# Patient Record
Sex: Female | Born: 1995 | Race: White | Hispanic: No | Marital: Single | Smoking: Never smoker
Health system: Southern US, Community
[De-identification: ages and names within clinical notes are randomized; demographics above are authoritative.]

## PROBLEM LIST (undated history)

## (undated) DIAGNOSIS — R55 Syncope and collapse: Secondary | ICD-10-CM

## (undated) HISTORY — DX: Syncope and collapse: R55

---
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
## 2017-08-23 ENCOUNTER — Ambulatory Visit: Payer: Managed Care, Other (non HMO) | Admitting: Family Medicine

## 2017-08-23 ENCOUNTER — Encounter: Payer: Self-pay | Admitting: Family Medicine

## 2017-08-23 VITALS — BP 106/78 | HR 96 | Temp 98.3°F | Wt 164.0 lb

## 2017-08-23 DIAGNOSIS — L600 Ingrowing nail: Secondary | ICD-10-CM | POA: Diagnosis not present

## 2017-08-23 MED ORDER — SULFAMETHOXAZOLE-TRIMETHOPRIM 800-160 MG PO TABS: 1.0000 | ORAL_TABLET | Freq: Two times a day (BID) | ORAL | 0 refills | Status: AC

## 2017-08-23 NOTE — Patient Instructions (Signed)
Ingrown Toenail An ingrown toenail occurs when the corner or sides of your toenail grow into the surrounding skin. The big toe is most commonly affected, but it can happen to any of your toes. If your ingrown toenail is not treated, you will be at risk for infection. What are the causes? This condition may be caused by:  Wearing shoes that are too small or tight.  Injury or trauma, such as stubbing your toe or having your toe stepped on.  Improper cutting or care of your toenails.  Being born with (congenital) nail or foot abnormalities, such as having a nail that is too big for your toe.  What increases the risk? Risk factors for an ingrown toenail include:  Age. Your nails tend to thicken as you get older, so ingrown nails are more common in older people.  Diabetes.  Cutting your toenails incorrectly.  Blood circulation problems.  What are the signs or symptoms? Symptoms may include:  Pain, soreness, or tenderness.  Redness.  Swelling.  Hardening of the skin surrounding the toe.  Your ingrown toenail may be infected if there is fluid, pus, or drainage. How is this diagnosed? An ingrown toenail may be diagnosed by medical history and physical exam. If your toenail is infected, your health care provider may test a sample of the drainage. How is this treated? Treatment depends on the severity of your ingrown toenail. Some ingrown toenails may be treated at home. More severe or infected ingrown toenails may require surgery to remove all or part of the nail. Infected ingrown toenails may also be treated with antibiotic medicines. Follow these instructions at home:  If you were prescribed an antibiotic medicine, finish all of it even if you start to feel better.  Soak your foot in warm soapy water for 20 minutes, 3 times per day or as directed by your health care provider.  Carefully lift the edge of the nail away from the sore skin by wedging a small piece of cotton under  the corner of the nail. This may help with the pain. Be careful not to cause more injury to the area.  Wear shoes that fit well. If your ingrown toenail is causing you pain, try wearing sandals, if possible.  Trim your toenails regularly and carefully. Do not cut them in a curved shape. Cut your toenails straight across. This prevents injury to the skin at the corners of the toenail.  Keep your feet clean and dry.  If you are having trouble walking and are given crutches by your health care provider, use them as directed.  Do not pick at your toenail or try to remove it yourself.  Take medicines only as directed by your health care provider.  Keep all follow-up visits as directed by your health care provider. This is important. Contact a health care provider if:  Your symptoms do not improve with treatment. Get help right away if:  You have red streaks that start at your foot and go up your leg.  You have a fever.  You have increased redness, swelling, or pain.  You have fluid, blood, or pus coming from your toenail. This information is not intended to replace advice given to you by your health care provider. Make sure you discuss any questions you have with your health care provider. Document Released: 01/14/2000 Document Revised: 06/18/2015 Document Reviewed: 12/10/2013 Elsevier Interactive Patient Education  2018 Elsevier Inc.  

## 2017-08-23 NOTE — Progress Notes (Signed)
Subjective:    Patient ID: Destiny Pruitt, female    DOB: [birth year], 22 y.o.   MRN: [id number]  No chief complaint on file.   HPI Patient was seen today for acute concern.  Pt endorses ingrown right great toenail.  She tried soaking in warm water and putting a piece of cotton underneath the toenail to relieve pressure.  In the past pt had left great toe nail partially removed.  Past Medical History:  Diagnosis Date  . Syncope    triggered by watching trauma    No Known Allergies  ROS General: Denies fever, chills, night sweats, changes in weight, changes in appetite HEENT: Denies headaches, ear pain, changes in vision, rhinorrhea, sore throat CV: Denies CP, palpitations, SOB, orthopnea Pulm: Denies SOB, cough, wheezing GI: Denies abdominal pain, nausea, vomiting, diarrhea, constipation GU: Denies dysuria, hematuria, frequency, vaginal discharge Msk: Denies muscle cramps, joint pains  +R great toe pain Neuro: Denies weakness, numbness, tingling Skin: Denies rashes, bruising Psych: Denies depression, anxiety, hallucinations     Objective:    Blood pressure 106/78, pulse 96, temperature 98.3 F (36.8 C), temperature source Oral, weight 164 lb (74.4 kg), SpO2 98 %.   Gen. Pleasant, well-nourished, in no distress, normal affect   Lungs: no accessory muscle use Cardiovascular: RRR, no peripheral edema Neuro:  A&Ox3, CN II-XII intact, normal gait Skin:  Warm, no rash.  R great toenail lateral edge erythematous, mildly edematous, with minimal clear to yellowish drainage.    Wt Readings from Last 3 Encounters:  08/23/17 164 lb (74.4 kg) (89 %, Z= 1.25)*  08/28/16 162 lb 8 oz (73.7 kg) (90 %, Z= 1.29)*  02/03/16 163 lb 6.4 oz (74.1 kg) (91 %, Z= 1.35)*   * Growth percentiles are based on CDC (Girls, 2-20 Years) data.    Lab Results  Component Value Date   HGB 15.5 11/16/2011    Assessment/Plan:  Ingrown toenail of right foot  -Continue soaking toe and using cotton  underneath nail edge. - Plan: Ambulatory referral to Podiatry, sulfamethoxazole-trimethoprim (BACTRIM DS,SEPTRA DS) 800-160 MG tablet  F/u prn  Grier Mitts, MD

## 2017-08-28 ENCOUNTER — Ambulatory Visit: Payer: BLUE CROSS/BLUE SHIELD | Admitting: Sports Medicine

## 2017-08-29 ENCOUNTER — Other Ambulatory Visit: Payer: Self-pay | Admitting: Family Medicine

## 2017-08-29 NOTE — Telephone Encounter (Signed)
Please advise refill in PCP absence.  Last OV w/ PCP was 1 year ago for acute visit.

## 2017-08-31 ENCOUNTER — Other Ambulatory Visit: Payer: Self-pay | Admitting: Family Medicine

## 2017-08-31 DIAGNOSIS — L700 Acne vulgaris: Secondary | ICD-10-CM

## 2017-09-01 ENCOUNTER — Ambulatory Visit: Payer: Managed Care, Other (non HMO) | Admitting: Sports Medicine

## 2017-09-01 ENCOUNTER — Encounter: Payer: Self-pay | Admitting: Sports Medicine

## 2017-09-01 VITALS — BP 125/80 | HR 80 | Temp 98.0°F

## 2017-09-01 DIAGNOSIS — L600 Ingrowing nail: Secondary | ICD-10-CM

## 2017-09-01 DIAGNOSIS — M79675 Pain in left toe(s): Secondary | ICD-10-CM | POA: Diagnosis not present

## 2017-09-01 DIAGNOSIS — M79674 Pain in right toe(s): Secondary | ICD-10-CM

## 2017-09-01 NOTE — Progress Notes (Signed)
Subjective: Destiny Pruitt is a 22 y.o. female patient presents to office today complaining of a moderately painful incurvated, red, hot, swollen Lateral>Medial nail borders of the 1st toes on the Right>Left foot. This has been present for years but last week got bad, bactrim completed 08/31/17. Patient denies fever/chills/nausea/vomitting/any other related constitutional symptoms at this time.  Admits history of ingrown nails as a child.   Review of Systems  All other systems reviewed and are negative.    Patient Active Problem List   Diagnosis Date Noted  . Viral URI with cough 12/20/2015  . Acne 11/16/2011  . Routine general medical examination at a health care facility 07/20/2011    Current Outpatient Medications on File Prior to Visit  Medication Sig Dispense Refill  . cephALEXin (KEFLEX) 500 MG capsule TAKE 1 CAPSULE BY MOUTH TWICE DAILY 100 capsule 4  . KELNOR 1/50 1-50 MG-MCG tablet TAKE 1 TABLET BY MOUTH EVERY DAY 84 tablet 0   No current facility-administered medications on file prior to visit.     No Known Allergies  Objective:  There were no vitals filed for this visit.  General: Well developed, nourished, in no acute distress, alert and oriented x3   Dermatology: Skin is warm, dry and supple bilateral. Right and left hallux nail appears to be  severely incurvated with hyperkeratosis formation at the distal aspects of  the lateral>medial nail borders. (+) Erythema. (+) Edema. (-) serosanguous  drainage present. The remaining nails appear unremarkable at this time. There are no open sores, lesions or other signs of infection present.  Vascular: Dorsalis Pedis artery and Posterior Tibial artery pedal pulses are 2/4 bilateral with immedate capillary fill time. Pedal hair growth present. No lower extremity edema.   Neruologic: Grossly intact via light touch bilateral.  Musculoskeletal: Tenderness to palpation of the right and left hallux nail fold(s). Muscular strength  within normal limits in all groups bilateral.   Assesement and Plan: Problem List Items Addressed This Visit    None    Visit Diagnoses    Ingrown nail    -  Primary   Toe pain, right       Toe pain, left         -Discussed treatment alternatives and plan of care; Explained permanent/temporary nail avulsion and post procedure course to patient. - After a verbal and written consent, injected 3 ml of a 50:50 mixture of 2% plain  lidocaine and 0.5% plain marcaine in a normal hallux block fashion. Next, a  betadine prep was performed. Anesthesia was tested and found to be appropriate.  The offending right and left hallux medial and lateral nail borders were then incised from the hyponychium to the epinychium. The offending nail border was removed and cleared from the field. The area was curretted for any remaining nail or spicules. Phenol application performed and the area was then flushed with alcohol and dressed with antibiotic cream and a dry sterile dressing. -Patient was instructed to leave the dressing intact for today and begin soaking  in a weak solution of betadine or Epsom salt and water tomorrow. Patient was instructed to  soak for 15 minutes each day and apply neosporin and a gauze or bandaid dressing each day. -Patient was instructed to monitor the toe for signs of infection and return to office if toe becomes red, hot or swollen. -Advised ice, elevation, and tylenol or motrin if needed for pain.  -Patient is to return in 1-2 weeks for follow up care/nail check or  sooner if problems arise.  Landis Martins, DPM

## 2017-09-01 NOTE — Patient Instructions (Signed)

## 2017-09-01 NOTE — Progress Notes (Signed)
   Subjective:    Patient ID: Destiny Pruitt, female    DOB: 12-09-1995, 22 y.o.   MRN: [id number]  HPI    Review of Systems  All other systems reviewed and are negative.      Objective:   Physical Exam        Assessment & Plan:

## 2017-09-03 NOTE — Telephone Encounter (Signed)
This is Dr Sherren Mocha pt, takes Cephalexin 500 mg for Acne, pt last OV was 09/01/2017 and last refill was 3/19 2018, Please advise if ok to refill.

## 2017-09-06 ENCOUNTER — Ambulatory Visit (INDEPENDENT_AMBULATORY_CARE_PROVIDER_SITE_OTHER): Payer: Self-pay

## 2017-09-06 DIAGNOSIS — L600 Ingrowing nail: Secondary | ICD-10-CM

## 2017-09-06 NOTE — Patient Instructions (Signed)

## 2017-09-11 NOTE — Progress Notes (Signed)
Patient presents for follow-up appointment today.  Procedure performed no avulsion bilateral hallux nails both borders, performed on 09/01/2017.  She states that her nails are little sore, but overall everything is feeling much better than it was before.  Noted well-healing sites, with slight maceration to the toes due to constant bandaging.  No redness, no erythema, no swelling, slight clear yellowish drainage noted on bandages.    Discussed continuation of soaking and Epson salt, bandage on during the day and off at night.  Written and verbal instructions were given and understood by patient.  I did advise her to follow-up in 2 weeks, but she states she is going to college at [employer] and will not be able to come in for her appointment.  I told her if there is any questions or concerns she could call our office, or she could go to an urgent care near her.

## 2017-11-24 ENCOUNTER — Other Ambulatory Visit: Payer: Self-pay | Admitting: Internal Medicine

## 2017-11-25 ENCOUNTER — Other Ambulatory Visit: Payer: Self-pay | Admitting: Family Medicine

## 2018-01-15 ENCOUNTER — Encounter: Payer: Managed Care, Other (non HMO) | Admitting: Family Medicine

## 2018-01-18 ENCOUNTER — Ambulatory Visit: Payer: Managed Care, Other (non HMO) | Admitting: Family Medicine

## 2018-01-18 ENCOUNTER — Encounter: Payer: Self-pay | Admitting: Family Medicine

## 2018-01-18 VITALS — BP 98/82 | HR 96 | Temp 98.2°F | Ht 66.0 in | Wt 167.0 lb

## 2018-01-18 DIAGNOSIS — N6001 Solitary cyst of right breast: Secondary | ICD-10-CM

## 2018-01-18 DIAGNOSIS — Z0000 Encounter for general adult medical examination without abnormal findings: Secondary | ICD-10-CM | POA: Diagnosis not present

## 2018-01-18 DIAGNOSIS — L700 Acne vulgaris: Secondary | ICD-10-CM | POA: Diagnosis not present

## 2018-01-18 DIAGNOSIS — Z23 Encounter for immunization: Secondary | ICD-10-CM

## 2018-01-18 NOTE — Patient Instructions (Signed)
Preventive Care 18-39 Years, Female Preventive care refers to lifestyle choices and visits with your health care provider that can promote health and wellness. What does preventive care include?   A yearly physical exam. This is also called an annual well check.  Dental exams once or twice a year.  Routine eye exams. Ask your health care provider how often you should have your eyes checked.  Personal lifestyle choices, including: ? Daily care of your teeth and gums. ? Regular physical activity. ? Eating a healthy diet. ? Avoiding tobacco and drug use. ? Limiting alcohol use. ? Practicing safe sex. ? Taking vitamin and mineral supplements as recommended by your health care provider. What happens during an annual well check? The services and screenings done by your health care provider during your annual well check will depend on your age, overall health, lifestyle risk factors, and family history of disease. Counseling Your health care provider may ask you questions about your:  Alcohol use.  Tobacco use.  Drug use.  Emotional well-being.  Home and relationship well-being.  Sexual activity.  Eating habits.  Work and work environment.  Method of birth control.  Menstrual cycle.  Pregnancy history. Screening You may have the following tests or measurements:  Height, weight, and BMI.  Diabetes screening. This is done by checking your blood sugar (glucose) after you have not eaten for a while (fasting).  Blood pressure.  Lipid and cholesterol levels. These may be checked every 5 years starting at age 20.  Skin check.  Hepatitis C blood test.  Hepatitis B blood test.  Sexually transmitted disease (STD) testing.  BRCA-related cancer screening. This may be done if you have a family history of breast, ovarian, tubal, or peritoneal cancers.  Pelvic exam and Pap test. This may be done every 3 years starting at age 21. Starting at age 30, this may be done every 5  years if you have a Pap test in combination with an HPV test. Discuss your test results, treatment options, and if necessary, the need for more tests with your health care provider. Vaccines Your health care provider may recommend certain vaccines, such as:  Influenza vaccine. This is recommended every year.  Tetanus, diphtheria, and acellular pertussis (Tdap, Td) vaccine. You may need a Td booster every 10 years.  Varicella vaccine. You may need this if you have not been vaccinated.  HPV vaccine. If you are 26 or younger, you may need three doses over 6 months.  Measles, mumps, and rubella (MMR) vaccine. You may need at least one dose of MMR. You may also need a second dose.  Pneumococcal 13-valent conjugate (PCV13) vaccine. You may need this if you have certain conditions and were not previously vaccinated.  Pneumococcal polysaccharide (PPSV23) vaccine. You may need one or two doses if you smoke cigarettes or if you have certain conditions.  Meningococcal vaccine. One dose is recommended if you are age 19-21 years and a first-year college student living in a residence hall, or if you have one of several medical conditions. You may also need additional booster doses.  Hepatitis A vaccine. You may need this if you have certain conditions or if you travel or work in places where you may be exposed to hepatitis A.  Hepatitis B vaccine. You may need this if you have certain conditions or if you travel or work in places where you may be exposed to hepatitis B.  Haemophilus influenzae type b (Hib) vaccine. You may need this if you   have certain risk factors. Talk to your health care provider about which screenings and vaccines you need and how often you need them. This information is not intended to replace advice given to you by your health care provider. Make sure you discuss any questions you have with your health care provider. Document Released: 03/14/2001 Document Revised: 08/29/2016  Document Reviewed: 11/17/2014 Elsevier Interactive Patient Education  2019 Elsevier Inc.  Breast Cyst  A breast cyst is a sac in the breast that is filled with fluid. Breast cysts are usually noncancerous (benign). They are common among women, and they are most often located in the upper, outer portion of the breast. One or more cysts may develop. They form when fluid builds up inside of the breast glands. There are several types of breast cysts:  Macrocyst. This is a cyst that is about 2 inches (5.1 cm) across (in diameter).  Microcyst. This is a very small cyst that you cannot feel, but it can be seen with imaging tests such as an X-ray of the breast (mammogram) or ultrasound.  Galactocele. This is a cyst that contains milk. It may develop if you suddenly stop breastfeeding. Breast cysts do not increase your risk of breast cancer. They usually disappear after menopause, unless you take artificial hormones (are on hormone therapy). What are the causes? The exact cause of breast cysts is not known. Possible causes include:  Blockage of tubes (ducts) in the breast glands, which leads to fluid buildup. Duct blockage may result from: ? Fibrocystic breast changes. This is a common, benign condition that occurs when women go through hormonal changes during the menstrual cycle. This is a common cause of multiple breast cysts. ? Overgrowth of breast tissue or breast glands. ? Scar tissue in the breast from previous surgery.  Changes in certain female hormones (estrogen and progesterone). What increases the risk? You may be more likely to develop breast cysts if you have not gone through menopause. What are the signs or symptoms? Symptoms of a breast cyst may include:  Feeling one or more smooth, round, soft lumps (like grapes) in the breast that are easily moveable. The lump(s) may get bigger and more painful before your period and get smaller after your period.  Breast discomfort or  pain. How is this diagnosed? A cyst can be felt during a physical exam by your health care provider. A mammogram and ultrasound will be done to confirm the diagnosis. Fluid may be removed from the cyst with a needle (fine-needle aspiration) and tested to make sure the cyst is not cancerous. How is this treated? Treatment may not be necessary. Your health care provider may monitor the cyst to see if it goes away on its own. If the cyst is uncomfortable or gets bigger, or if you do not like how the cyst makes your breast look, you may need treatment. Treatment may include:  Hormone treatment.  Fine-needle aspiration, to drain fluid from the cyst. There is a chance of the cyst coming back (recurring) after aspiration.  Surgery to remove the cyst. Follow these instructions at home:  See your health care provider regularly. ? Get a yearly physical exam. ? If you are 15-29 years old, get a clinical breast exam every 1-3 years. After age 23, get this exam every year. ? Get mammograms as often as directed.  Do a breast self-exam every month, or as often as directed. Having many breast cysts, or "lumpy" breasts, may make it harder to feel for new  lumps. Understand how your breasts normally look and feel, and write down any changes in your breasts so you can tell your health care provider about the changes. A breast self-exam involves: ? Comparing your breasts in the mirror. ? Looking for visible changes in your skin or nipples. ? Feeling for lumps or changes.  Take over-the-counter and prescription medicines only as told by your health care provider.  Wear a supportive bra, especially when exercising.  Follow instructions from your health care provider about eating and drinking restrictions. ? Avoid caffeine. ? Cut down on salt (sodium) in what you eat and drink, especially before your menstrual period. Too much sodium can cause fluid buildup (retention), breast swelling, and discomfort.  Keep  all follow-up visits as told your health care provider. This is important. Contact a health care provider if:  You feel, or think you feel, a lump in your breast.  You notice that both breasts look or feel different than usual.  Your breast is still causing pain after your menstrual period is over.  You find new lumps or bumps that were not there before.  You feel lumps in your armpit (axilla). Get help right away if:  You have severe pain, tenderness, redness, or warmth in your breast.  You have fluid or blood leaking from your nipple.  Your breast lump becomes hard and painful.  You notice dimpling or wrinkling of the breast or nipple. This information is not intended to replace advice given to you by your health care provider. Make sure you discuss any questions you have with your health care provider. Document Released: 01/16/2005 Document Revised: 10/08/2015 Document Reviewed: 10/08/2015 Elsevier Interactive Patient Education  2019 [employer].

## 2018-01-18 NOTE — Progress Notes (Signed)
Subjective:     Destiny Pruitt is a 22 y.o. female and is here for a comprehensive physical exam. The patient reports problems - lump in R breast..  Pt recently noticed an area in side of right breast that is not painful.  Pt denies nipple discharge, nipple inversion, erythema, skin changes.  Patient is currently on her menses.  Patient takes OCPs [employer]er, fashion/clothing).  Acne: -Taking Keflex 500 mg twice daily -Trying to increase p.o. intake of water -Cleaning face regularly -Notices increase in acne while on menses  H/o Ingrown toenails: -Pt seen by this provider several months ago  -went to podiatry -Had partial nail removal -States feels much better.  Denies pain.  Allergies: NKDA  Social history: Pt is single.  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 Pt is home for winter break.  She is excited to see her sister who will be coming home from [place].  Pt will also be celebrating her birthday on December 26th. Social History   Socioeconomic History  . Marital status: Single    Spouse name: Not on file  . Number of children: Not on file  . Years of education: Not on file  . Highest education level: Not on file  Occupational History  . Not on file  Social Needs  . Financial resource strain: Not on file  . Food insecurity:    Worry: Not on file    Inability: Not on file  . Transportation needs:    Medical: Not on file    Non-medical: Not on file  Tobacco Use  . Smoking status: Never Smoker  . Smokeless tobacco: Never Used  Substance and Sexual Activity  . Alcohol use: Not on file  . Drug use: Not on file  . Sexual activity: Not on file  Lifestyle  . Physical activity:    Days per week: Not on file    Minutes per session: Not on file  . Stress: Not on file  Relationships  . Social connections:    Talks on phone: Not on file    Gets together: Not on file   Attends religious service: Not on file    Active member of club or organization: Not on file    Attends meetings of clubs or organizations: Not on file    Relationship status: Not on file  . Intimate partner violence:    Fear of current or ex partner: Not on file    Emotionally abused: Not on file    Physically abused: Not on file    Forced sexual activity: Not on file  Other Topics Concern  . Not on file  Social History Narrative  . Not on file   Health Maintenance  Topic Date Due  . INFLUENZA VACCINE  08/30/2017  . HIV Screening  08/18/2024 (Originally 01/24/2013)  . TETANUS/TDAP  02/15/2018    The following portions of the patient's history were reviewed and updated as appropriate: allergies, current medications, past family history, past medical history, past social history, past surgical history and problem list.  Review of Systems Pertinent items noted in HPI and remainder of comprehensive ROS otherwise negative.   Objective:    BP 98/82 (BP Location: Right Arm, Patient Position: Sitting, Cuff Size: Normal)   Pulse 96   Temp 98.2 F (36.8 C)   Ht 5\' 6"  (1.676 m)   Wt 167 lb (75.8 kg)  LMP 01/15/2018 (Exact Date)   SpO2 98%   BMI 26.95 kg/m  General appearance: alert, cooperative, appears stated age and no distress Head: Normocephalic, without obvious abnormality, atraumatic Eyes: conjunctivae/corneas clear. PERRL, EOM's intact. Fundi benign. Ears: normal TM's and external ear canals both ears Nose: Nares normal. Septum midline. Mucosa normal. No drainage or sinus tenderness. Throat: lips, mucosa, and tongue normal; teeth and gums normal Neck: no adenopathy, no carotid bruit, no JVD, supple, symmetrical, trachea midline and thyroid not enlarged, symmetric, no tenderness/mass/nodules Lungs: clear to auscultation bilaterally Breasts: No nipple discharge or bleeding, No axillary or supraclavicular adenopathy, Normal in appearance.  Round mobile 1.5 cm cystic-like  smooth mass noted in R lateral breast at 8 O'clock position.  no nipple retraction, erythema, or peau d' orange noted. Heart: regular rate and rhythm, S1, S2 normal, no murmur, click, rub or gallop Abdomen: soft, non-tender; bowel sounds normal; no masses,  no organomegaly Extremities: extremities normal, atraumatic, no cyanosis or edema Skin: Skin color, texture, turgor normal. No rashes or lesions Neurologic: Alert and oriented X 3, normal strength and tone. Normal symmetric reflexes. Normal coordination and gait    Assessment:    Healthy female exam with a R breast mass.      Plan:     Anticipatory guidance given including wearing seatbelts, smoke detectors in the home, increasing physical activity, increasing p.o. intake of water and vegetables. -declines labs at this time. -continue OCPs -given handout -next CPE in 1 yr See After Visit Summary for Counseling Recommendations   Cystic breast mass -likely cystic lesion given smooth, round composition and correlation with menses -discussed observation vs breast u/s.  Pt wishes to observe area.  If becomes increased in size or does not resolve after menses will obtain u/s. -given handout  Acne -continue keflex -discussed increasing po intake of water  Need for immunization against influenza -influenza vaccine given this visit.  Follow-up PRN in the next month or 2 if area and breast remain  Grier Mitts, MD

## 2018-02-25 ENCOUNTER — Other Ambulatory Visit: Payer: Self-pay | Admitting: Family Medicine

## 2018-02-25 NOTE — Telephone Encounter (Signed)
Requested medication (s) are due for refill today: yes  Requested medication (s) are on the active medication list: yes  Last refill:  11/27/17  Future visit scheduled: yes  Notes to clinic:  Medication previously ordered by Dr. Sherren Mocha. LOV with Dr. Volanda Napoleon on 01/18/18    Requested Prescriptions  Pending Prescriptions Disp Refills   ethynodiol-ethinyl estradiol (KELNOR 1/50) 1-50 MG-MCG tablet 84 tablet 0    Sig: Take 1 tablet by mouth daily.     OB/GYN:  Contraceptives Passed - 02/25/2018 12:08 PM      Passed - Last BP in normal range    BP Readings from Last 1 Encounters:  01/18/18 98/82         Passed - Valid encounter within last 12 months    Recent Outpatient Visits          1 month ago Well adult exam   [facility] at [place], [place]   6 months ago Ingrown toenail of right foot   [facility]rap[facility] at [place], [place]   1 year ago Viral URI with cough   [place] [facility] [place], [place]   2 years ago Acute maxillary sinusitis, recurrence not specified   Therap[facility] at [facility] [place], [place]   2 years ago Reactive airway disease that is not asthma   Therapist, music at [place], MD      Future Appointments            In 75 months Volanda Napoleon, Langley Adie, MD [employer] at [place], [facility]

## 2018-02-25 NOTE — Telephone Encounter (Incomplete)
Copied from [place] [phone]. Topic: Quick Communication - Rx Refill/Question >> Feb 25, 2018 10:52 AM Leward Quan A wrote:  Medication: Marliss Coots 1/50 1-50 MG-MCG tablet   Patient is all out need Rx today tried to get refill last week    Has the patient contacted their pharmacy? {yes [id number]} (Agent: If no, request that the patient contact the pharmacy for the refill.) (Agent: If yes, when and what did the pharmacy advise?)  Preferred Pharmacy (with phone number or street name): [id number] - [address], [place] AT [address] [phone] (Phone) [phone] (Fax)    Agent: Please be advised that RX refills may take up to 3 business days. We ask that you follow-up with your pharmacy.

## 2018-02-26 MED ORDER — ETHYNODIOL DIAC-ETH ESTRADIOL 1-50 MG-MCG PO TABS: 1.0000 | ORAL_TABLET | Freq: Every day | ORAL | 0 refills | Status: DC

## 2018-02-26 NOTE — Telephone Encounter (Signed)
Ok to refill 

## 2018-05-21 ENCOUNTER — Other Ambulatory Visit: Payer: Self-pay | Admitting: Family Medicine

## 2018-07-24 ENCOUNTER — Ambulatory Visit (INDEPENDENT_AMBULATORY_CARE_PROVIDER_SITE_OTHER): Payer: Managed Care, Other (non HMO) | Admitting: Family Medicine

## 2018-07-24 ENCOUNTER — Other Ambulatory Visit: Payer: Self-pay

## 2018-07-24 ENCOUNTER — Encounter: Payer: Self-pay | Admitting: Family Medicine

## 2018-07-24 VITALS — BP 108/72 | HR 72 | Temp 98.2°F | Wt 168.0 lb

## 2018-07-24 DIAGNOSIS — Z029 Encounter for administrative examinations, unspecified: Secondary | ICD-10-CM | POA: Diagnosis not present

## 2018-07-24 NOTE — Progress Notes (Signed)
Subjective:    Patient ID: Destiny Pruitt, female    DOB: 01/06/1996, 23 y.o.   MRN: [id number]  No chief complaint on file.   HPI Patient was seen today for f/u and form completion.  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  Pt denies any issues.  Past Medical History:  Diagnosis Date  . Syncope    triggered by watching trauma    No Known Allergies  ROS General: Denies fever, chills, night sweats, changes in weight, changes in appetite HEENT: Denies headaches, ear pain, changes in vision, rhinorrhea, sore throat CV: Denies CP, palpitations, SOB, orthopnea Pulm: Denies SOB, cough, wheezing GI: Denies abdominal pain, nausea, vomiting, diarrhea, constipation GU: Denies dysuria, hematuria, frequency, vaginal discharge Msk: Denies muscle cramps, joint pains Neuro: Denies weakness, numbness, tingling Skin: Denies rashes, bruising Psych: Denies depression, anxiety, hallucinations     Objective:    Blood pressure 108/72, pulse 72, temperature 98.2 F (36.8 C), temperature source Oral, weight 168 lb (76.2 kg), SpO2 98 %.  Gen. Pleasant, well-nourished, in no distress, normal affect   HEENT: [place]/AT, face symmetric, no scleral icterus, PERRLA, nares patent without drainage Lungs: no accessory muscle use, CTAB, no wheezes or rales Cardiovascular: RRR, no m/r/g, no peripheral edema Abdomen: BS present, soft, NT/N Neuro:  A&Ox3, CN II-XII intact, normal gait Skin:  Warm, no lesions/ rash  Wt Readings from Last 3 Encounters:  07/24/18 168 lb (76.2 kg)  01/18/18 167 lb (75.8 kg) (90 %, Z= 1.30)*  08/23/17 164 lb (74.4 kg) (89 %, Z= 1.25)*   * Growth percentiles are based on CDC (Girls, 2-20 Years) data.    Lab Results  Component Value Date   HGB 15.5 11/16/2011    Assessment/Plan:  Administrative encounter  -CPE up to date, done 01/18/18 -form completed for student teaching  F/u  prn  Grier Mitts, MD

## 2018-08-12 ENCOUNTER — Other Ambulatory Visit: Payer: Self-pay | Admitting: Family Medicine

## 2018-11-11 ENCOUNTER — Other Ambulatory Visit: Payer: Self-pay | Admitting: Family Medicine

## 2018-11-11 MED ORDER — ETHYNODIOL DIAC-ETH ESTRADIOL 1-50 MG-MCG PO TABS: 1.0000 | ORAL_TABLET | Freq: Every day | ORAL | 0 refills | Status: DC

## 2018-11-11 NOTE — Telephone Encounter (Signed)
KELNOR 1/50 1-50 MG-MCG tablet   Send to [facility]

## 2019-01-21 ENCOUNTER — Other Ambulatory Visit: Payer: Self-pay

## 2019-01-22 ENCOUNTER — Ambulatory Visit (INDEPENDENT_AMBULATORY_CARE_PROVIDER_SITE_OTHER): Payer: Managed Care, Other (non HMO) | Admitting: Family Medicine

## 2019-01-22 ENCOUNTER — Encounter: Payer: Self-pay | Admitting: Family Medicine

## 2019-01-22 VITALS — BP 98/78 | HR 110 | Temp 97.8°F | Wt 170.0 lb

## 2019-01-22 DIAGNOSIS — Z0000 Encounter for general adult medical examination without abnormal findings: Secondary | ICD-10-CM

## 2019-01-22 NOTE — Patient Instructions (Signed)
Preventive Care 18-21 Years Old, Female Preventive care refers to lifestyle choices and visits with your health care provider that can promote health and wellness. At this stage in your life, you may start seeing a primary care physician instead of a pediatrician. Your health care is now your responsibility. Preventive care for young adults includes:  A yearly physical exam. This is also called an annual wellness visit.  Regular dental and eye exams.  Immunizations.  Screening for certain conditions.  Healthy lifestyle choices, such as diet and exercise. What can I expect for my preventive care visit? Physical exam Your health care provider may check:  Height and weight. These may be used to calculate body mass index (BMI), which is a measurement that tells if you are at a healthy weight.  Heart rate and blood pressure.  Body temperature. Counseling Your health care provider may ask you questions about:  Past medical problems and family medical history.  Alcohol, tobacco, and drug use.  Home and relationship well-being.  Access to firearms.  Emotional well-being.  Diet, exercise, and sleep habits.  Sexual activity and sexual health.  Method of birth control.  Menstrual cycle.  Pregnancy history. What immunizations do I need?  Influenza (flu) vaccine  This is recommended every year. Tetanus, diphtheria, and pertussis (Tdap) vaccine  You may need a Td booster every 10 years. Varicella (chickenpox) vaccine  You may need this vaccine if you have not already been vaccinated. Human papillomavirus (HPV) vaccine  If recommended by your health care provider, you may need three doses over 6 months. Measles, mumps, and rubella (MMR) vaccine  You may need at least one dose of MMR. You may also need a second dose. Meningococcal conjugate (MenACWY) vaccine  One dose is recommended if you are 19-21 years old and a first-year college student living in a residence hall,  or if you have one of several medical conditions. You may also need additional booster doses. Pneumococcal conjugate (PCV13) vaccine  You may need this if you have certain conditions and were not previously vaccinated. Pneumococcal polysaccharide (PPSV23) vaccine  You may need one or two doses if you smoke cigarettes or if you have certain conditions. Hepatitis A vaccine  You may need this if you have certain conditions or if you travel or work in places where you may be exposed to hepatitis A. Hepatitis B vaccine  You may need this if you have certain conditions or if you travel or work in places where you may be exposed to hepatitis B. Haemophilus influenzae type b (Hib) vaccine  You may need this if you have certain risk factors. You may receive vaccines as individual doses or as more than one vaccine together in one shot (combination vaccines). Talk with your health care provider about the risks and benefits of combination vaccines. What tests do I need? Blood tests  Lipid and cholesterol levels. These may be checked every 5 years starting at age 20.  Hepatitis C test.  Hepatitis B test. Screening  Pelvic exam and Pap test. This may be done every 3 years starting at age 21.  Sexually transmitted disease (STD) testing, if you are at risk.  BRCA-related cancer screening. This may be done if you have a family history of breast, ovarian, tubal, or peritoneal cancers. Other tests  Tuberculosis skin test.  Vision and hearing tests.  Skin exam.  Breast exam. Follow these instructions at home: Eating and drinking   Eat a diet that includes fresh fruits and   vegetables, whole grains, lean protein, and low-fat dairy products.  Drink enough fluid to keep your urine pale yellow.  Do not drink alcohol if: ? Your health care provider tells you not to drink. ? You are pregnant, may be pregnant, or are planning to become pregnant. ? You are under the legal drinking age. In the  [place] the legal drinking age is 21.  If you drink alcohol: ? Limit how much you have to 0-1 drink a day. ? Be aware of how much alcohol is in your drink. In the [place] one drink equals one 12 oz bottle of beer (355 mL), one 5 oz glass of wine (148 mL), or one 1 oz glass of hard liquor (44 mL). Lifestyle  Take daily care of your teeth and gums.  Stay active. Exercise at least 30 minutes 5 or more days of the week.  Do not use any products that contain nicotine or tobacco, such as cigarettes, e-cigarettes, and chewing tobacco. If you need help quitting, ask your health care provider.  Do not use drugs.  If you are sexually active, practice safe sex. Use a condom or other form of birth control (contraception) in order to prevent pregnancy and STIs (sexually transmitted infections). If you plan to become pregnant, see your health care provider for a pre-conception visit.  Find healthy ways to cope with stress, such as: ? Meditation, yoga, or listening to music. ? Journaling. ? Talking to a trusted person. ? Spending time with friends and family. Safety  Always wear your seat belt while driving or riding in a vehicle.  Do not drive if you have been drinking alcohol. Do not ride with someone who has been drinking.  Do not drive when you are tired or distracted. Do not text while driving.  Wear a helmet and other protective equipment during sports activities.  If you have firearms in your house, make sure you follow all gun safety procedures.  Seek help if you have been bullied, physically abused, or sexually abused.  Use the Internet responsibly to avoid dangers such as online bullying and online sex predators. What's next?  Go to your health care provider once a year for a well check visit.  Ask your health care provider how often you should have your eyes and teeth checked.  Stay up to date on all vaccines. This information is not intended to replace advice given to you by  your health care provider. Make sure you discuss any questions you have with your health care provider. Document Released: 06/03/2015 Document Revised: 01/10/2018 Document Reviewed: 01/10/2018 Elsevier Patient Education  2020 Elsevier Inc.  

## 2019-01-22 NOTE — Progress Notes (Signed)
Subjective:     Destiny Pruitt is a 23 y.o. female and is here for a comprehensive physical exam. Pt is not fasting.  The patient reports no problems.  Pt notes improvement in acne since using Cerave cleanser and washing face BID.  Acne flares prior to menses.  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  Pt states this last virtual semester went well, she finished classes at Thanksgiving.  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  Pt will be celebrating her birthday with her sister.  Social History   Socioeconomic History  . Marital status: Single    Spouse name: Not on file  . Number of children: Not on file  . Years of education: Not on file  . Highest education level: Not on file  Occupational History  . Not on file  Tobacco Use  . Smoking status: Never Smoker  . Smokeless tobacco: Never Used  Substance and Sexual Activity  . Alcohol use: Not on file  . Drug use: Not on file  . Sexual activity: Not on file  Other Topics Concern  . Not on file  Social History Narrative  . Not on file   Social Determinants of Health   Financial Resource Strain:   . Difficulty of Paying Living Expenses: Not on file  Food Insecurity:   . Worried About Charity fundraiser in the Last Year: Not on file  . Ran Out of Food in the Last Year: Not on file  Transportation Needs:   . Lack of Transportation (Medical): Not on file  . Lack of Transportation (Non-Medical): Not on file  Physical Activity:   . Days of Exercise per Week: Not on file  . Minutes of Exercise per Session: Not on file  Stress:   . Feeling of Stress : Not on file  Social Connections:   . Frequency of Communication with Friends and Family: Not on file  . Frequency of Social Gatherings with Friends and Family: Not on file  . Attends Religious Services: Not on file  . Active Member of Clubs or Organizations: Not on file  . Attends Archivist Meetings: Not on  file  . Marital Status: Not on file  Intimate Partner Violence:   . Fear of Current or Ex-Partner: Not on file  . Emotionally Abused: Not on file  . Physically Abused: Not on file  . Sexually Abused: Not on file   Health Maintenance  Topic Date Due  . TETANUS/TDAP  02/15/2018  . INFLUENZA VACCINE  08/31/2018  . HIV Screening  08/18/2024 (Originally 01/24/2013)    The following portions of the patient's history were reviewed and updated as appropriate: allergies, current medications, past family history, past medical history, past social history, past surgical history and problem list.  Review of Systems Pertinent items noted in HPI and remainder of comprehensive ROS otherwise negative.   Objective:    BP 98/78 (BP Location: Right Arm, Patient Position: Sitting, Cuff Size: Normal)   Pulse (!) 110   Temp 97.8 F (36.6 C) (Temporal)   Wt 170 lb (77.1 kg)   SpO2 98%   BMI 27.44 kg/m  General appearance: alert, cooperative and no distress Head: Normocephalic, without obvious abnormality, atraumatic Eyes: conjunctivae/corneas clear. PERRL, EOM's intact. Fundi benign. Ears: normal TM's and external ear canals both ears Nose: Nares normal. Septum midline. Mucosa normal. No drainage or sinus tenderness. Throat: lips, mucosa, and tongue  normal; teeth and gums normal Neck: no adenopathy, no carotid bruit, no JVD, supple, symmetrical, trachea midline and thyroid not enlarged, symmetric, no tenderness/mass/nodules Lungs: clear to auscultation bilaterally Heart: regular rate and rhythm, S1, S2 normal, no murmur, click, rub or gallop Abdomen: soft, non-tender; bowel sounds normal; no masses,  no organomegaly Pelvic: deferred Extremities: extremities normal, atraumatic, no cyanosis or edema Pulses: 2+ and symmetric Skin: Skin color, texture, turgor normal. No rashes or lesions Lymph nodes: Cervical, supraclavicular, and axillary nodes normal. Neurologic: Alert and oriented X 3, normal  strength and tone. Normal symmetric reflexes. Normal coordination and gait    Assessment:    Healthy female exam.      Plan:     Anticipatory guidance given including wearing seatbelts, smoke detectors in the home, increasing physical activity, increasing p.o. intake of water and vegetables. -influenza vaccine up to date -will obtain labs next CPE. -pap not indicated 2/2 age -given handout -next CPE in 1 yr. See After Visit Summary for Counseling Recommendations

## 2019-02-01 ENCOUNTER — Other Ambulatory Visit: Payer: Self-pay | Admitting: Family Medicine

## 2019-03-30 ENCOUNTER — Other Ambulatory Visit: Payer: Self-pay | Admitting: Family Medicine

## 2019-07-18 ENCOUNTER — Other Ambulatory Visit: Payer: Self-pay | Admitting: Family Medicine

## 2019-07-18 ENCOUNTER — Other Ambulatory Visit: Payer: Self-pay

## 2019-07-18 NOTE — Telephone Encounter (Signed)
Spoke with pt state that her 1st day of her menstrual  cycle was 07/15/2019. Pt Birth control sent to her pharmacy as requested

## 2019-09-05 ENCOUNTER — Ambulatory Visit: Payer: Managed Care, Other (non HMO) | Admitting: Family Medicine

## 2019-09-05 ENCOUNTER — Other Ambulatory Visit: Payer: Self-pay

## 2019-09-05 ENCOUNTER — Encounter: Payer: Self-pay | Admitting: Family Medicine

## 2019-09-05 VITALS — BP 98/70 | HR 88 | Temp 98.6°F | Wt 178.0 lb

## 2019-09-05 DIAGNOSIS — K649 Unspecified hemorrhoids: Secondary | ICD-10-CM

## 2019-09-05 DIAGNOSIS — R194 Change in bowel habit: Secondary | ICD-10-CM

## 2019-09-05 MED ORDER — HYDROCORTISONE (PERIANAL) 2.5 % EX CREA: 1.0000 "application " | TOPICAL_CREAM | Freq: Two times a day (BID) | CUTANEOUS | 0 refills | Status: DC

## 2019-09-05 NOTE — Progress Notes (Signed)
Subjective:    Patient ID: Destiny Pruitt, female    DOB: 1995/05/25, 24 y.o.   MRN: [id number]  No chief complaint on file.   HPI Patient was seen today for ongoing concern.  Patient endorses changes in bowels x1 month.  Patient also noticed an area of skin at anus that appears when having episodes of constipation.  Also having loose stools. last BM yesterday, was normal.  Had mild bleeding, but none recently.  Endorses increased stress as graduated in May from [place] [employer].  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  Patient is also in the process of moving into her apartment in [place].  Pt notes needing to drink cold water and eating better.  Patient has not tried anything for her symptoms.  Patient denies itching, burning, rash, nausea, vomiting.  Past Medical History:  Diagnosis Date  . Syncope    triggered by watching trauma    No Known Allergies  ROS General: Denies fever, chills, night sweats, changes in weight, changes in appetite HEENT: Denies headaches, ear pain, changes in vision, rhinorrhea, sore throat CV: Denies CP, palpitations, SOB, orthopnea Pulm: Denies SOB, cough, wheezing GI: Denies abdominal pain, nausea, vomiting  + constipation, loose stools, extra skin at anus GU: Denies dysuria, hematuria, frequency, vaginal discharge  Msk: Denies muscle cramps, joint pains Neuro: Denies weakness, numbness, tingling Skin: Denies rashes, bruising Psych: Denies depression, anxiety, hallucinations      Objective:    Blood pressure 98/70, pulse 88, temperature 98.6 F (37 C), temperature source Oral, weight 178 lb (80.7 kg), SpO2 98 %.   Gen. Pleasant, well-nourished, in no distress, normal affect   HEENT: [place]/AT, face symmetric, conjunctiva clear, no scleral icterus, PERRLA, EOMI, nares patent without drainage Lungs: no accessory muscle use Cardiovascular: RRR, no peripheral edema GU: Normal external female genitalia without rash or  irritation.  Mild hemorrhoids noted externally at anus.  DRE deferred. Neuro:  A&Ox3, CN II-XII intact, normal gait   Wt Readings from Last 3 Encounters:  09/05/19 178 lb (80.7 kg)  01/22/19 170 lb (77.1 kg)  07/24/18 168 lb (76.2 kg)    Lab Results  Component Value Date   HGB 15.5 11/16/2011    Assessment/Plan:  Hemorrhoids, unspecified hemorrhoid type  -Mild hemorrhoids noted on exam -Discussed decreasing constipation -Rx for hydrocortisone cream sent to pharmacy.  Patient advised if insurance will not pay for cream can try OTC Preparation H or Tucks. -Given handouts -Consider referral to GI for continued or worsening symptoms - Plan: hydrocortisone (ANUSOL-HC) 2.5 % rectal cream  Bowel habit changes -Episodes of constipation and loose stools. -Discussed possible causes including changes in diet, increased stress causing IBS.  Also consider fecal impaction. -Discussed dietary changes -Patient to increase p.o. intake of butter -For constipation okay to take MiraLAX.  Given sample in clinic. -Discussed low FODMAP diet. -Also consider probiotic -Given handouts -Continue to monitor  F/u as needed  Grier Mitts, MD

## 2019-09-05 NOTE — Patient Instructions (Addendum)
You can try a probiotic.  It can be found over the counter at your local drug store.  You can also use Miralax daily as needed for constipation.  Hemorrhoids Hemorrhoids are swollen veins in and around the rectum or anus. There are two types of hemorrhoids:  Internal hemorrhoids. These occur in the veins that are just inside the rectum. They may poke through to the outside and become irritated and painful.  External hemorrhoids. These occur in the veins that are outside the anus and can be felt as a painful swelling or hard lump near the anus. Most hemorrhoids do not cause serious problems, and they can be managed with home treatments such as diet and lifestyle changes. If home treatments do not help the symptoms, procedures can be done to shrink or remove the hemorrhoids. What are the causes? This condition is caused by increased pressure in the anal area. This pressure may result from various things, including:  Constipation.  Straining to have a bowel movement.  Diarrhea.  Pregnancy.  Obesity.  Sitting for long periods of time.  Heavy lifting or other activity that causes you to strain.  Anal sex.  Riding a bike for a long period of time. What are the signs or symptoms? Symptoms of this condition include:  Pain.  Anal itching or irritation.  Rectal bleeding.  Leakage of stool (feces).  Anal swelling.  One or more lumps around the anus. How is this diagnosed? This condition can often be diagnosed through a visual exam. Other exams or tests may also be done, such as:  An exam that involves feeling the rectal area with a gloved hand (digital rectal exam).  An exam of the anal canal that is done using a small tube (anoscope).  A blood test, if you have lost a significant amount of blood.  A test to look inside the colon using a flexible tube with a camera on the end (sigmoidoscopy or colonoscopy). How is this treated? This condition can usually be treated at home.  However, various procedures may be done if dietary changes, lifestyle changes, and other home treatments do not help your symptoms. These procedures can help make the hemorrhoids smaller or remove them completely. Some of these procedures involve surgery, and others do not. Common procedures include:  Rubber band ligation. Rubber bands are placed at the base of the hemorrhoids to cut off their blood supply.  Sclerotherapy. Medicine is injected into the hemorrhoids to shrink them.  Infrared coagulation. A type of light energy is used to get rid of the hemorrhoids.  Hemorrhoidectomy surgery. The hemorrhoids are surgically removed, and the veins that supply them are tied off.  Stapled hemorrhoidopexy surgery. The surgeon staples the base of the hemorrhoid to the rectal wall. Follow these instructions at home: Eating and drinking   Eat foods that have a lot of fiber in them, such as whole grains, beans, nuts, fruits, and vegetables.  Ask your health care provider about taking products that have added fiber (fiber supplements).  Reduce the amount of fat in your diet. You can do this by eating low-fat dairy products, eating less red meat, and avoiding processed foods.  Drink enough fluid to keep your urine pale yellow. Managing pain and swelling   Take warm sitz baths for 20 minutes, 3-4 times a day to ease pain and discomfort. You may do this in a bathtub or using a portable sitz bath that fits over the toilet.  If directed, apply ice to the  affected area. Using ice packs between sitz baths may be helpful. ? Put ice in a plastic bag. ? Place a towel between your skin and the bag. ? Leave the ice on for 20 minutes, 2-3 times a day. General instructions  Take over-the-counter and prescription medicines only as told by your health care provider.  Use medicated creams or suppositories as told.  Get regular exercise. Ask your health care provider how much and what kind of exercise is best  for you. In general, you should do moderate exercise for at least 30 minutes on most days of the week (150 minutes each week). This can include activities such as walking, biking, or yoga.  Go to the bathroom when you have the urge to have a bowel movement. Do not wait.  Avoid straining to have bowel movements.  Keep the anal area dry and clean. Use wet toilet paper or moist towelettes after a bowel movement.  Do not sit on the toilet for long periods of time. This increases blood pooling and pain.  Keep all follow-up visits as told by your health care provider. This is important. Contact a health care provider if you have:  Increasing pain and swelling that are not controlled by treatment or medicine.  Difficulty having a bowel movement, or you are unable to have a bowel movement.  Pain or inflammation outside the area of the hemorrhoids. Get help right away if you have:  Uncontrolled bleeding from your rectum. Summary  Hemorrhoids are swollen veins in and around the rectum or anus.  Most hemorrhoids can be managed with home treatments such as diet and lifestyle changes.  Taking warm sitz baths can help ease pain and discomfort.  In severe cases, procedures or surgery can be done to shrink or remove the hemorrhoids. This information is not intended to replace advice given to you by your health care provider. Make sure you discuss any questions you have with your health care provider. Document Revised: 06/14/2018 Document Reviewed: 06/07/2017 Elsevier Patient Education  [place]  FODMAPs (fermentable oligosaccharides, disaccharides, monosaccharides, and polyols) are sugars that are hard for some people to digest. A low-FODMAP eating plan may help some people who have bowel (intestinal) diseases to manage their symptoms. This meal plan can be complicated to follow. Work with a diet and nutrition specialist (dietitian) to make a low-FODMAP eating  plan that is right for you. A dietitian can make sure that you get enough nutrition from this diet. What are tips for following this plan? Reading food labels  Check labels for hidden FODMAPs such as: ? High-fructose syrup. ? Honey. ? Agave. ? Natural fruit flavors. ? Onion or garlic powder.  Choose low-FODMAP foods that contain 3-4 grams of fiber per serving.  Check food labels for serving sizes. Eat only one serving at a time to make sure FODMAP levels stay low. Meal planning  Follow a low-FODMAP eating plan for up to 6 weeks, or as told by your health care provider or dietitian.  To follow the eating plan: 1. Eliminate high-FODMAP foods from your diet completely. 2. Gradually reintroduce high-FODMAP foods into your diet one at a time. Most people should wait a few days after introducing one high-FODMAP food before they introduce the next high-FODMAP food. Your dietitian can recommend how quickly you may reintroduce foods. 3. Keep a daily record of what you eat and drink, and make note of any symptoms that you have after eating. 4. Review your  daily record with a dietitian regularly. Your dietitian can help you identify which foods you can eat and which foods you should avoid. General tips  Drink enough fluid each day to keep your urine pale yellow.  Avoid processed foods. These often have added sugar and may be high in FODMAPs.  Avoid most dairy products, whole grains, and sweeteners.  Work with a dietitian to make sure you get enough fiber in your diet. Recommended foods Grains  Gluten-free grains, such as rice, oats, buckwheat, quinoa, corn, polenta, and millet. Gluten-free pasta, bread, or cereal. Rice noodles. Corn tortillas. Vegetables  Eggplant, zucchini, cucumber, peppers, green beans, Brussels sprouts, bean sprouts, lettuce, arugula, kale, Swiss chard, spinach, collard greens, bok choy, summer squash, potato, and tomato. Limited amounts of corn, carrot, and sweet  potato. Green parts of scallions. Fruits  Bananas, oranges, lemons, limes, blueberries, raspberries, strawberries, grapes, cantaloupe, honeydew melon, kiwi, papaya, passion fruit, and pineapple. Limited amounts of dried cranberries, banana chips, and shredded coconut. Dairy  Lactose-free milk, yogurt, and kefir. Lactose-free cottage cheese and ice cream. Non-dairy milks, such as almond, coconut, hemp, and rice milk. Yogurts made of non-dairy milks. Limited amounts of goat cheese, brie, mozzarella, parmesan, swiss, and other hard cheeses. Meats and other protein foods  Unseasoned beef, pork, poultry, or fish. Eggs. Berniece Salines. Tofu (firm) and tempeh. Limited amounts of nuts and seeds, such as almonds, walnuts, [place] nuts, pecans, peanuts, pumpkin seeds, chia seeds, and sunflower seeds. Fats and oils  Butter-free spreads. Vegetable oils, such as olive, canola, and sunflower oil. Seasoning and other foods  Artificial sweeteners with names that do not end in "ol" such as aspartame, saccharine, and stevia. Maple syrup, white table sugar, raw sugar, brown sugar, and molasses. Fresh basil, coriander, parsley, rosemary, and thyme. Beverages  Water and mineral water. Sugar-sweetened soft drinks. Small amounts of orange juice or cranberry juice. Black and green tea. Most dry wines. Coffee. This may not be a complete list of low-FODMAP foods. Talk with your dietitian for more information. Foods to avoid Grains  Wheat, including kamut, durum, and semolina. Barley and bulgur. Couscous. Wheat-based cereals. Wheat noodles, bread, crackers, and pastries. Vegetables  Chicory root, artichoke, asparagus, cabbage, snow peas, sugar snap peas, mushrooms, and cauliflower. Onions, garlic, leeks, and the white part of scallions. Fruits  Fresh, dried, and juiced forms of apple, pear, watermelon, peach, plum, cherries, apricots, blackberries, boysenberries, figs, nectarines, and mango. Avocado. Dairy  Milk, yogurt,  ice cream, and soft cheese. Cream and sour cream. Milk-based sauces. Custard. Meats and other protein foods  Fried or fatty meat. Sausage. Cashews and pistachios. Soybeans, baked beans, black beans, chickpeas, kidney beans, fava beans, navy beans, lentils, and split peas. Seasoning and other foods  Any sugar-free gum or candy. Foods that contain artificial sweeteners such as sorbitol, mannitol, isomalt, or xylitol. Foods that contain honey, high-fructose corn syrup, or agave. Bouillon, vegetable stock, beef stock, and chicken stock. Garlic and onion powder. Condiments made with onion, such as hummus, chutney, pickles, relish, salad dressing, and salsa. Tomato paste. Beverages  Chicory-based drinks. Coffee substitutes. Chamomile tea. Fennel tea. Sweet or fortified wines such as port or sherry. Diet soft drinks made with isomalt, mannitol, maltitol, sorbitol, or xylitol. Apple, pear, and mango juice. Juices with high-fructose corn syrup. This may not be a complete list of high-FODMAP foods. Talk with your dietitian to discuss what dietary choices are best for you.  Summary  A low-FODMAP eating plan is a short-term diet that eliminates FODMAPs from your  diet to help ease symptoms of certain bowel diseases.  The eating plan usually lasts up to 6 weeks. After that, high-FODMAP foods are restarted gradually, one at a time, so you can find out which may be causing symptoms.  A low-FODMAP eating plan can be complicated. It is best to work with a dietitian who has experience with this type of plan. This information is not intended to replace advice given to you by your health care provider. Make sure you discuss any questions you have with your health care provider. Document Revised: 12/29/2016 Document Reviewed: 09/12/2016 Elsevier Patient Education  [place].

## 2019-10-06 ENCOUNTER — Other Ambulatory Visit: Payer: Self-pay | Admitting: Family Medicine

## 2020-01-03 ENCOUNTER — Other Ambulatory Visit: Payer: Self-pay | Admitting: Family Medicine

## 2020-01-07 NOTE — Telephone Encounter (Signed)
Pt is calling in stating that she is out of [facility] 1/50 1-50 MG and has been our for a few days and the pharmacy stated that they sent [place] a [place] to [place].   Pharm: [facility] in [place], [place]

## 2020-01-08 NOTE — Telephone Encounter (Signed)
Prior Authorization has been submitted, pt will be notified when approved

## 2020-01-15 ENCOUNTER — Other Ambulatory Visit: Payer: Self-pay

## 2020-01-15 MED ORDER — ETHYNODIOL DIAC-ETH ESTRADIOL 1-50 MG-MCG PO TABS: 1.0000 | ORAL_TABLET | Freq: Every day | ORAL | 0 refills | Status: DC

## 2020-01-28 ENCOUNTER — Encounter: Payer: Self-pay | Admitting: Family Medicine

## 2020-01-28 ENCOUNTER — Ambulatory Visit (INDEPENDENT_AMBULATORY_CARE_PROVIDER_SITE_OTHER): Payer: Managed Care, Other (non HMO) | Admitting: Family Medicine

## 2020-01-28 ENCOUNTER — Other Ambulatory Visit: Payer: Self-pay

## 2020-01-28 VITALS — BP 98/72 | HR 86 | Temp 97.6°F | Ht 66.0 in | Wt 169.0 lb

## 2020-01-28 DIAGNOSIS — H66002 Acute suppurative otitis media without spontaneous rupture of ear drum, left ear: Secondary | ICD-10-CM | POA: Diagnosis not present

## 2020-01-28 DIAGNOSIS — Z23 Encounter for immunization: Secondary | ICD-10-CM

## 2020-01-28 DIAGNOSIS — Z0001 Encounter for general adult medical examination with abnormal findings: Secondary | ICD-10-CM

## 2020-01-28 DIAGNOSIS — Z3041 Encounter for surveillance of contraceptive pills: Secondary | ICD-10-CM | POA: Diagnosis not present

## 2020-01-28 LAB — LIPID PANEL
Cholesterol: 178 mg/dL (ref 0–200)
HDL: 69.9 mg/dL (ref >39.00)
LDL Cholesterol: 86 mg/dL (ref 0–99)
NonHDL: 108.57
Total CHOL/HDL Ratio: 3
Triglycerides: 115 mg/dL (ref 0.0–149.0)
VLDL: 23 mg/dL (ref 0.0–40.0)

## 2020-01-28 MED ORDER — AMOXICILLIN 500 MG PO TABS: 500.0000 mg | ORAL_TABLET | Freq: Two times a day (BID) | ORAL | 0 refills | Status: AC

## 2020-01-28 MED ORDER — ETHYNODIOL DIAC-ETH ESTRADIOL 1-50 MG-MCG PO TABS: 1.0000 | ORAL_TABLET | Freq: Every day | ORAL | 3 refills | Status: DC

## 2020-01-28 NOTE — Patient Instructions (Signed)
Preventive Care 21-22 Years Old, Female Preventive care refers to visits with your health care provider and lifestyle choices that can promote health and wellness. This includes:  A yearly physical exam. This may also be called an annual well check.  Regular dental visits and eye exams.  Immunizations.  Screening for certain conditions.  Healthy lifestyle choices, such as eating a healthy diet, getting regular exercise, not using drugs or products that contain nicotine and tobacco, and limiting alcohol use. What can I expect for my preventive care visit? Physical exam Your health care provider will check your:  Height and weight. This may be used to calculate body mass index (BMI), which tells if you are at a healthy weight.  Heart rate and blood pressure.  Skin for abnormal spots. Counseling Your health care provider may ask you questions about your:  Alcohol, tobacco, and drug use.  Emotional well-being.  Home and relationship well-being.  Sexual activity.  Eating habits.  Work and work environment.  Method of birth control.  Menstrual cycle.  Pregnancy history. What immunizations do I need?  Influenza (flu) vaccine  This is recommended every year. Tetanus, diphtheria, and pertussis (Tdap) vaccine  You may need a Td booster every 10 years. Varicella (chickenpox) vaccine  You may need this if you have not been vaccinated. Human papillomavirus (HPV) vaccine  If recommended by your health care provider, you may need three doses over 6 months. Measles, mumps, and rubella (MMR) vaccine  You may need at least one dose of MMR. You may also need a second dose. Meningococcal conjugate (MenACWY) vaccine  One dose is recommended if you are age 19-21 years and a first-year college student living in a residence hall, or if you have one of several medical conditions. You may also need additional booster doses. Pneumococcal conjugate (PCV13) vaccine  You may need  this if you have certain conditions and were not previously vaccinated. Pneumococcal polysaccharide (PPSV23) vaccine  You may need one or two doses if you smoke cigarettes or if you have certain conditions. Hepatitis A vaccine  You may need this if you have certain conditions or if you travel or work in places where you may be exposed to hepatitis A. Hepatitis B vaccine  You may need this if you have certain conditions or if you travel or work in places where you may be exposed to hepatitis B. Haemophilus influenzae type b (Hib) vaccine  You may need this if you have certain conditions. You may receive vaccines as individual doses or as more than one vaccine together in one shot (combination vaccines). Talk with your health care provider about the risks and benefits of combination vaccines. What tests do I need?  Blood tests  Lipid and cholesterol levels. These may be checked every 5 years starting at age 20.  Hepatitis C test.  Hepatitis B test. Screening  Diabetes screening. This is done by checking your blood sugar (glucose) after you have not eaten for a while (fasting).  Sexually transmitted disease (STD) testing.  BRCA-related cancer screening. This may be done if you have a family history of breast, ovarian, tubal, or peritoneal cancers.  Pelvic exam and Pap test. This may be done every 3 years starting at age 21. Starting at age 30, this may be done every 5 years if you have a Pap test in combination with an HPV test. Talk with your health care provider about your test results, treatment options, and if necessary, the need for more tests.   Follow these instructions at home: Eating and drinking   Eat a diet that includes fresh fruits and vegetables, whole grains, lean protein, and low-fat dairy.  Take vitamin and mineral supplements as recommended by your health care provider.  Do not drink alcohol if: ? Your health care provider tells you not to drink. ? You are  pregnant, may be pregnant, or are planning to become pregnant.  If you drink alcohol: ? Limit how much you have to 0-1 drink a day. ? Be aware of how much alcohol is in your drink. In the [place] one drink equals one 12 oz bottle of beer (355 mL), one 5 oz glass of wine (148 mL), or one 1 oz glass of hard liquor (44 mL). Lifestyle  Take daily care of your teeth and gums.  Stay active. Exercise for at least 30 minutes on 5 or more days each week.  Do not use any products that contain nicotine or tobacco, such as cigarettes, e-cigarettes, and chewing tobacco. If you need help quitting, ask your health care provider.  If you are sexually active, practice safe sex. Use a condom or other form of birth control (contraception) in order to prevent pregnancy and STIs (sexually transmitted infections). If you plan to become pregnant, see your health care provider for a preconception visit. What's next?  Visit your health care provider once a year for a well check visit.  Ask your health care provider how often you should have your eyes and teeth checked.  Stay up to date on all vaccines. This information is not intended to replace advice given to you by your health care provider. Make sure you discuss any questions you have with your health care provider. Document Revised: 09/27/2017 Document Reviewed: 09/27/2017 Elsevier Patient Education  [place].  Otitis Media, Adult  Otitis media occurs when there is inflammation and fluid in the middle ear. Your middle ear is a part of the ear that contains bones for hearing as well as air that helps send sounds to your brain. What are the causes? This condition is caused by a blockage in the eustachian tube. This tube drains fluid from the ear to the back of the nose (nasopharynx). A blockage in this tube can be caused by an object or by swelling (edema) in the tube. Problems that can cause a blockage include:  A cold or other upper respiratory  infection.  Allergies.  An irritant, such as tobacco smoke.  Enlarged adenoids. The adenoids are areas of soft tissue located high in the back of the throat, behind the nose and the roof of the mouth.  A mass in the nasopharynx.  Damage to the ear caused by pressure changes (barotrauma). What are the signs or symptoms? Symptoms of this condition include:  Ear pain.  A fever.  Decreased hearing.  A headache.  Tiredness (lethargy).  Fluid leaking from the ear.  Ringing in the ear. How is this diagnosed? This condition is diagnosed with a physical exam. During the exam your health care provider will use an instrument called an otoscope to look into your ear and check for redness, swelling, and fluid. He or she will also ask about your symptoms. Your health care provider may also order tests, such as:  A test to check the movement of the eardrum (pneumatic otoscopy). This test is done by squeezing a small amount of air into the ear.  A test that changes air pressure in the middle ear to check how  well the eardrum moves and whether the eustachian tube is working (tympanogram). How is this treated? This condition usually goes away on its own within 3-5 days. But if the condition is caused by a bacteria infection and does not go away own its own, or keeps coming back, your health care provider may:  Prescribe antibiotic medicines to treat the infection.  Prescribe or recommend medicines to control pain. Follow these instructions at home:  Take over-the-counter and prescription medicines only as told by your health care provider.  If you were prescribed an antibiotic medicine, take it as told by your health care provider. Do not stop taking the antibiotic even if you start to feel better.  Keep all follow-up visits as told by your health care provider. This is important. Contact a health care provider if:  You have bleeding from your nose.  There is a lump on your  neck.  You are not getting better in 5 days.  You feel worse instead of better. Get help right away if:  You have severe pain that is not controlled with medicine.  You have swelling, redness, or pain around your ear.  You have stiffness in your neck.  A part of your face is paralyzed.  The bone behind your ear (mastoid) is tender when you touch it.  You develop a severe headache. Summary  Otitis media is redness, soreness, and swelling of the middle ear.  This condition usually goes away on its own within 3-5 days.  If the problem does not go away in 3-5 days, your health care provider may prescribe or recommend medicines to treat your symptoms.  If you were prescribed an antibiotic medicine, take it as told by your health care provider. This information is not intended to replace advice given to you by your health care provider. Make sure you discuss any questions you have with your health care provider. Document Revised: 12/29/2016 Document Reviewed: 01/07/2016 Elsevier Patient Education  2020 [employer].

## 2020-01-28 NOTE — Progress Notes (Signed)
Subjective:     Destiny Pruitt is a 24 y.o. female and is here for a comprehensive physical exam. The patient reports problems - L ear feels clogged.  Patient notes nasal congestion which started last week.  Denies sore throat, cough, fever, chills, feeling bad.  Patient endorses ear feeling clogged starting a few days ago.  Patient states she feels like this when she is needs to get more rest.  Patient states otherwise she is doing well, just had a birthday.  Requesting refills on OCPs.  Last Pap 07/14/2019 with OB/GYN.  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  Patient is currently on winter break.  Patient not having influenza during Thanksgiving break.  Has yet to get a flu shot or COVID booster.  Social History   Socioeconomic History  . Marital status: Single    Spouse name: Not on file  . Number of children: Not on file  . Years of education: Not on file  . Highest education level: Not on file  Occupational History  . Not on file  Tobacco Use  . Smoking status: Never Smoker  . Smokeless tobacco: Never Used  Substance and Sexual Activity  . Alcohol use: Not on file  . Drug use: Not on file  . Sexual activity: Not on file  Other Topics Concern  . Not on file  Social History Narrative  . Not on file   Social Determinants of Health   Financial Resource Strain: Not on file  Food Insecurity: Not on file  Transportation Needs: Not on file  Physical Activity: Not on file  Stress: Not on file  Social Connections: Not on file  Intimate Partner Violence: Not on file   Health Maintenance  Topic Date Due  . Hepatitis C Screening  Never done  . COVID-19 Vaccine (1) Never done  . TETANUS/TDAP  02/15/2018  . PAP SMEAR-Modifier  Never done  . INFLUENZA VACCINE  08/31/2019  . HIV Screening  08/18/2024 (Originally 01/24/2013)  . PAP-Cervical Cytology Screening  10/07/2022    The following portions of the patient's history were reviewed and updated as appropriate:  allergies, current medications, past family history, past medical history, past social history, past surgical history and problem list.  Review of Systems Pertinent items noted in HPI and remainder of comprehensive ROS otherwise negative.   Objective:    BP 98/72 (BP Location: Left Arm, Patient Position: Sitting, Cuff Size: Normal)   Pulse 86   Temp 97.6 F (36.4 C) (Oral)   Ht 5\' 6"  (1.676 m)   Wt 169 lb (76.7 kg)   LMP 12/28/2019 (Approximate)   SpO2 98%   BMI 27.28 kg/m  General appearance: alert, cooperative and no distress Head: Normocephalic, without obvious abnormality, atraumatic Eyes: conjunctivae/corneas clear. PERRL, EOM's intact. Fundi benign. Ears: Normal external ears and canals bilaterally.  Left TM bulging with erythema and suppurative fluid.  Right TM full. Nose: Nares normal. Septum midline. Mucosa normal. No drainage or sinus tenderness. Throat: lips, mucosa, and tongue normal; teeth and gums normal Neck: no carotid bruit, no JVD, supple, symmetrical, trachea midline, thyroid not enlarged, symmetric, no tenderness/mass/nodules and Mild cervical lymphadenopathy Lungs: clear to auscultation bilaterally Heart: regular rate and rhythm, S1, S2 normal, no murmur, click, rub or gallop Abdomen: soft, non-tender; bowel sounds normal; no masses,  no organomegaly Extremities: extremities normal, atraumatic, no cyanosis or edema Pulses: 2+ and symmetric Skin: Skin color, texture, turgor normal. No rashes or lesions Lymph nodes: Cervical, supraclavicular, and axillary nodes  normal. Neurologic: Alert and oriented X 3, normal strength and tone. Normal symmetric reflexes. Normal coordination and gait    Assessment:    Healthy female exam with left acute otitis media     Plan:     Anticipatory guidance given including wearing seatbelts, smoke detectors in the home, increasing physical activity, increasing p.o. intake of water and vegetables. -We will obtain lipid  panel. -Pap up-to-date done 07/14/2019 by OB/GYN.  Next Pap in 3 years. -Given handout -Next CPE in 1 year See After Visit Summary for Counseling Recommendations    Non-recurrent acute suppurative otitis media of left ear without spontaneous rupture of tympanic membrane -Supportive care - Plan: amoxicillin (AMOXIL) 500 MG tablet  Need for influenza vaccination -Vaccine given this visit  Encounter for surveillance of contraceptive pills  -Stable, working well -OCPs refilled -Plan: ethynodiol-ethinyl estradiol (KELNOR 1/50) 1-50 MG-MCG tablet  Follow-up as needed  Abbe Amsterdam, MD

## 2020-01-28 NOTE — Addendum Note (Signed)
Addended by: Lerry Liner on: 01/28/2020 08:38 AM   Modules accepted: Orders

## 2020-01-28 NOTE — Addendum Note (Signed)
Addended by: Lerry Liner on: 01/28/2020 08:37 AM   Modules accepted: Orders

## 2020-01-28 NOTE — Addendum Note (Signed)
Addended by: Carola Rhine on: 01/28/2020 08:41 AM   Modules accepted: Orders

## 2021-01-28 ENCOUNTER — Encounter: Payer: Managed Care, Other (non HMO) | Admitting: Family Medicine

## 2021-07-20 ENCOUNTER — Encounter: Payer: Self-pay | Admitting: Family Medicine

## 2021-07-20 ENCOUNTER — Ambulatory Visit (INDEPENDENT_AMBULATORY_CARE_PROVIDER_SITE_OTHER): Payer: Managed Care, Other (non HMO) | Admitting: Family Medicine

## 2021-07-20 VITALS — BP 110/72 | HR 74 | Temp 98.4°F | Ht 67.0 in | Wt 178.4 lb

## 2021-07-20 DIAGNOSIS — Z13220 Encounter for screening for lipoid disorders: Secondary | ICD-10-CM | POA: Diagnosis not present

## 2021-07-20 DIAGNOSIS — K649 Unspecified hemorrhoids: Secondary | ICD-10-CM

## 2021-07-20 DIAGNOSIS — Z23 Encounter for immunization: Secondary | ICD-10-CM

## 2021-07-20 DIAGNOSIS — F419 Anxiety disorder, unspecified: Secondary | ICD-10-CM | POA: Diagnosis not present

## 2021-07-20 DIAGNOSIS — Z1159 Encounter for screening for other viral diseases: Secondary | ICD-10-CM

## 2021-07-20 DIAGNOSIS — H7291 Unspecified perforation of tympanic membrane, right ear: Secondary | ICD-10-CM

## 2021-07-20 DIAGNOSIS — D229 Melanocytic nevi, unspecified: Secondary | ICD-10-CM

## 2021-07-20 DIAGNOSIS — Z0000 Encounter for general adult medical examination without abnormal findings: Secondary | ICD-10-CM | POA: Diagnosis not present

## 2021-07-20 DIAGNOSIS — E049 Nontoxic goiter, unspecified: Secondary | ICD-10-CM | POA: Diagnosis not present

## 2021-07-20 LAB — CBC WITH DIFFERENTIAL/PLATELET
Basophils Absolute: 0 10*3/uL (ref 0.0–0.1)
Basophils Relative: 0.6 % (ref 0.0–3.0)
Eosinophils Absolute: 0 10*3/uL (ref 0.0–0.7)
Eosinophils Relative: 0.6 % (ref 0.0–5.0)
HCT: 39.4 % (ref 36.0–46.0)
Hemoglobin: 13.2 g/dL (ref 12.0–15.0)
Lymphocytes Relative: 21.8 % (ref 12.0–46.0)
Lymphs Abs: 1.5 10*3/uL (ref 0.7–4.0)
MCHC: 33.5 g/dL (ref 30.0–36.0)
MCV: 92.5 fl (ref 78.0–100.0)
Monocytes Absolute: 0.5 10*3/uL (ref 0.1–1.0)
Monocytes Relative: 7.7 % (ref 3.0–12.0)
Neutro Abs: 4.8 10*3/uL (ref 1.4–7.7)
Neutrophils Relative %: 69.3 % (ref 43.0–77.0)
Platelets: 320 10*3/uL (ref 150.0–400.0)
RBC: 4.26 Mil/uL (ref 3.87–5.11)
RDW: 13.3 % (ref 11.5–15.5)
WBC: 7 10*3/uL (ref 4.0–10.5)

## 2021-07-20 LAB — BASIC METABOLIC PANEL
BUN: 11 mg/dL (ref 6–23)
CO2: 26 mEq/L (ref 19–32)
Calcium: 9.4 mg/dL (ref 8.4–10.5)
Chloride: 104 mEq/L (ref 96–112)
Creatinine, Ser: 0.76 mg/dL (ref 0.40–1.20)
GFR: 110.4 mL/min (ref >60.00)
Glucose, Bld: 83 mg/dL (ref 70–99)
Potassium: 4 mEq/L (ref 3.5–5.1)
Sodium: 137 mEq/L (ref 135–145)

## 2021-07-20 LAB — LIPID PANEL
Cholesterol: 154 mg/dL (ref 0–200)
HDL: 68.2 mg/dL (ref >39.00)
LDL Cholesterol: 71 mg/dL (ref 0–99)
NonHDL: 86.17
Total CHOL/HDL Ratio: 2
Triglycerides: 75 mg/dL (ref 0.0–149.0)
VLDL: 15 mg/dL (ref 0.0–40.0)

## 2021-07-20 LAB — TSH: TSH: 1.12 u[IU]/mL (ref 0.35–5.50)

## 2021-07-20 LAB — HEMOGLOBIN A1C: Hgb A1c MFr Bld: 5.2 % (ref 4.6–6.5)

## 2021-07-20 LAB — T4, FREE: Free T4: 0.73 ng/dL (ref 0.60–1.60)

## 2021-07-20 MED ORDER — HYDROCORTISONE (PERIANAL) 2.5 % EX CREA: 1.0000 | TOPICAL_CREAM | Freq: Two times a day (BID) | CUTANEOUS | 1 refills | Status: AC

## 2021-07-20 NOTE — Progress Notes (Signed)
Subjective:     Destiny Pruitt is a 26 y.o. female and is here for a comprehensive physical exam. The patient reports doing well.  Started therapy 2x/mo and zoloft '50mg'$  2 wks ago for anxiety.  States initially had issues with zoloft, but doing better on the med.    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  Pt notes a freckle on right breast/chest that has become darker in color and slightly raised.  Area is nonpruritic, without drainage or bleeding.  Pt notes R AOM a few months ago, seen at [facility].  Also had to have ear irrigated at that time.  Pt requesting refill on anusol.  No recent issues with hemorrhoids.  Social History   Socioeconomic History   Marital status: Single    Spouse name: Not on file   Number of children: Not on file   Years of education: Not on file   Highest education level: Not on file  Occupational History   Not on file  Tobacco Use   Smoking status: Never   Smokeless tobacco: Never  Substance and Sexual Activity   Alcohol use: Not on file   Drug use: Not on file   Sexual activity: Not on file  Other Topics Concern   Not on file  Social History Narrative   Not on file   Social Determinants of Health   Financial Resource Strain: Not on file  Food Insecurity: Not on file  Transportation Needs: Not on file  Physical Activity: Not on file  Stress: Not on file  Social Connections: Not on file  Intimate Partner Violence: Not on file   Health Maintenance  Topic Date Due   COVID-19 Vaccine (1) Never done   Hepatitis C Screening  Never done   TETANUS/TDAP  02/15/2018   HIV Screening  08/18/2024 (Originally 01/24/2013)   INFLUENZA VACCINE  08/30/2021   PAP-Cervical Cytology Screening  10/07/2022   PAP SMEAR-Modifier  08/12/2023   HPV VACCINES  Completed    The following portions of the patient's history were reviewed and updated as appropriate: allergies, current medications, past family history,  past medical history, past social history, past surgical history, and problem list.  Review of Systems Pertinent items noted in HPI and remainder of comprehensive ROS otherwise negative.   Objective:    BP 110/72 (BP Location: Left Arm, Patient Position: Sitting, Cuff Size: Normal)   Temp 98.4 F (36.9 C) (Oral)   Ht '5\' 7"'$  (1.702 m)   SpO2 95%   BMI 26.47 kg/m  General appearance: alert, cooperative, and no distress Head: Normocephalic, without obvious abnormality, atraumatic Eyes: conjunctivae/corneas clear. PERRL, EOM's intact. Fundi benign. Ears: normal TM and external ear canal left ear and abnormal TM right ear - perforation: antero-superior 25% Nose: Nares normal. Septum midline. Mucosa normal. No drainage or sinus tenderness. Throat: lips, mucosa, and tongue normal; teeth and gums normal Neck: no adenopathy, no carotid bruit, no JVD, supple, symmetrical, trachea midline.  Right lobe thyroid mildly enlarged enlarged without tenderness/mass/nodules Lungs: clear to auscultation bilaterally Heart: regular rate and rhythm, S1, S2 normal, no murmur, click, rub or gallop Abdomen: soft, non-tender; bowel sounds normal; no masses,  no organomegaly Extremities: extremities normal, atraumatic, no cyanosis or edema Pulses: 2+ and symmetric Skin: Skin color, texture, turgor normal. No rashes or lesions 4 mm hyperpigmented slightly raised nevus with slightly irregular inferior border medial right breast/medial chest. Lymph nodes: Cervical, supraclavicular, and axillary nodes  normal. Neurologic: Alert and oriented X 3, normal strength and tone. Normal symmetric reflexes. Normal coordination and gait    Assessment:    Healthy female exam.      Plan:    Anticipatory guidance given including wearing seatbelts, smoke detectors in the home, increasing physical activity, increasing p.o. intake of water and vegetables. -Order labs -Pap up-to-date done 07/14/2019 with OB/GYN -Immunizations  reviewed.  Tdap due -Given handout -Next CPE in 1 year See After Visit Summary for Counseling Recommendations  - Plan: CBC with Differential/Platelet, Basic metabolic panel, Hemoglobin A1c, Lipid panel  Atypical nevus -Discussed removal options  - Plan: Ambulatory referral to Dermatology  Goiter  - Plan: TSH, T4, Free  Perforation of right tympanic membrane -Noted on exam, asymptomatic -Discussed expectant management -Given handout -Given precautions -Advised to avoid submerging head/getting water/fluids and ear.  If swims use earplugs.  Anxiety -stable -Continue Zoloft 50 mg daily as prescribed by another provider via telemedicine -Continue counseling -Continue self-care -Continue to monitor  - Plan: CBC with Differential/Platelet, TSH, T4, Free  Screening for cholesterol level  - Plan: Lipid panel  Encounter for hepatitis C screening test for low risk patient  - Plan: Hep C Antibody  Hemorrhoids, unspecified hemorrhoid type  -stable - Plan: hydrocortisone (ANUSOL-HC) 2.5 % rectal cream  Need for Tdap vaccination -given this visit  F/u prn  Grier Mitts, MD

## 2021-07-21 LAB — HEPATITIS C ANTIBODY: Hepatitis C Ab: NONREACTIVE

## 2021-09-01 ENCOUNTER — Other Ambulatory Visit: Payer: Self-pay | Admitting: Family Medicine

## 2021-09-01 DIAGNOSIS — Z3041 Encounter for surveillance of contraceptive pills: Secondary | ICD-10-CM

## 2022-07-21 ENCOUNTER — Telehealth: Payer: Self-pay | Admitting: Family Medicine

## 2022-07-21 NOTE — Telephone Encounter (Signed)
Pt states she lives in [place] and really does not travel to [place] that often, and is wondering if MD could refer her to a good MD in [place]?

## 2022-07-24 ENCOUNTER — Encounter: Payer: Managed Care, Other (non HMO) | Admitting: Family Medicine

## 2022-08-10 NOTE — Telephone Encounter (Signed)
Is pt closer to a certain area in [place]?.  Will have to look into this further to see if there is anyone I can suggest.

## 2022-11-06 NOTE — Telephone Encounter (Signed)
Providers contacted not currently taking new pts.
